# Patient Record
Sex: Male | Born: 1995 | Hispanic: Yes | Marital: Single | Smoking: Never smoker
Health system: Southern US, Community
[De-identification: ages and names within clinical notes are randomized; demographics above are authoritative.]

---
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
## 2016-01-26 IMAGING — CR LEFT RIBS AND CHEST - 3+ VIEW
5 series · 5 of 5 positions shown · non-contrast
Comparison: None.

CLINICAL DATA: LEFT-sided pain.  Fall 6 months ago.  Chest pain

EXAM:
LEFT RIBS AND CHEST - 3+ VIEW

[chest pa]
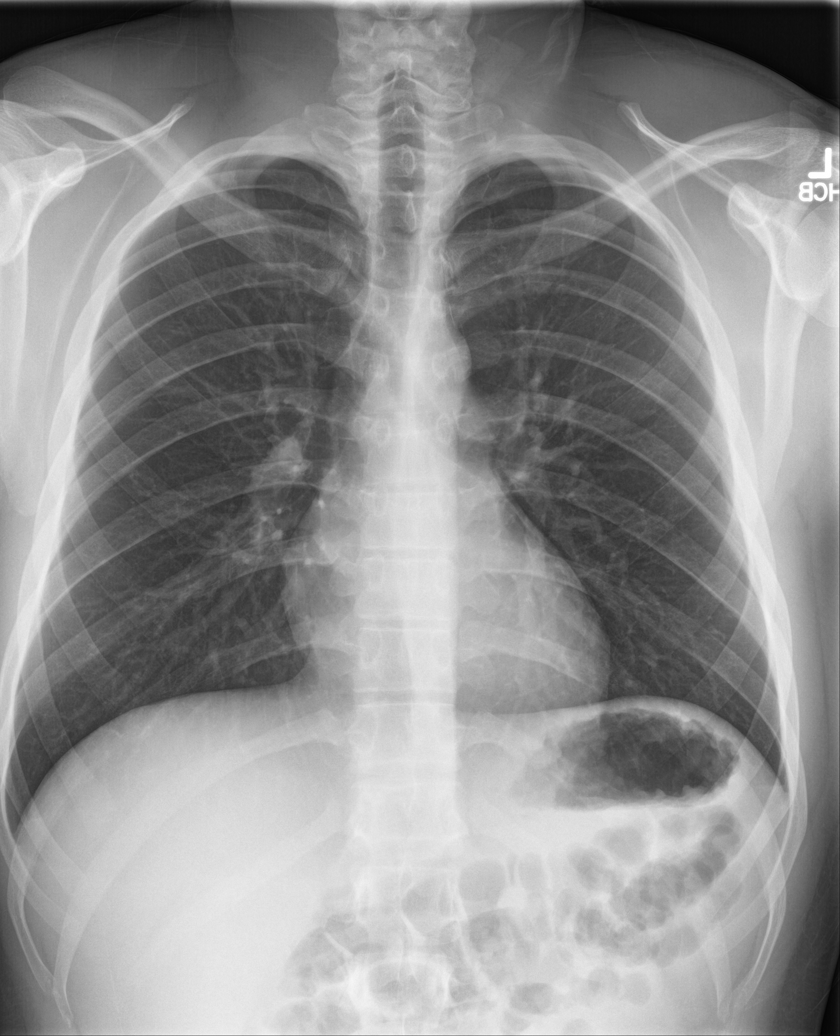

[rib pa (1 of 2)]
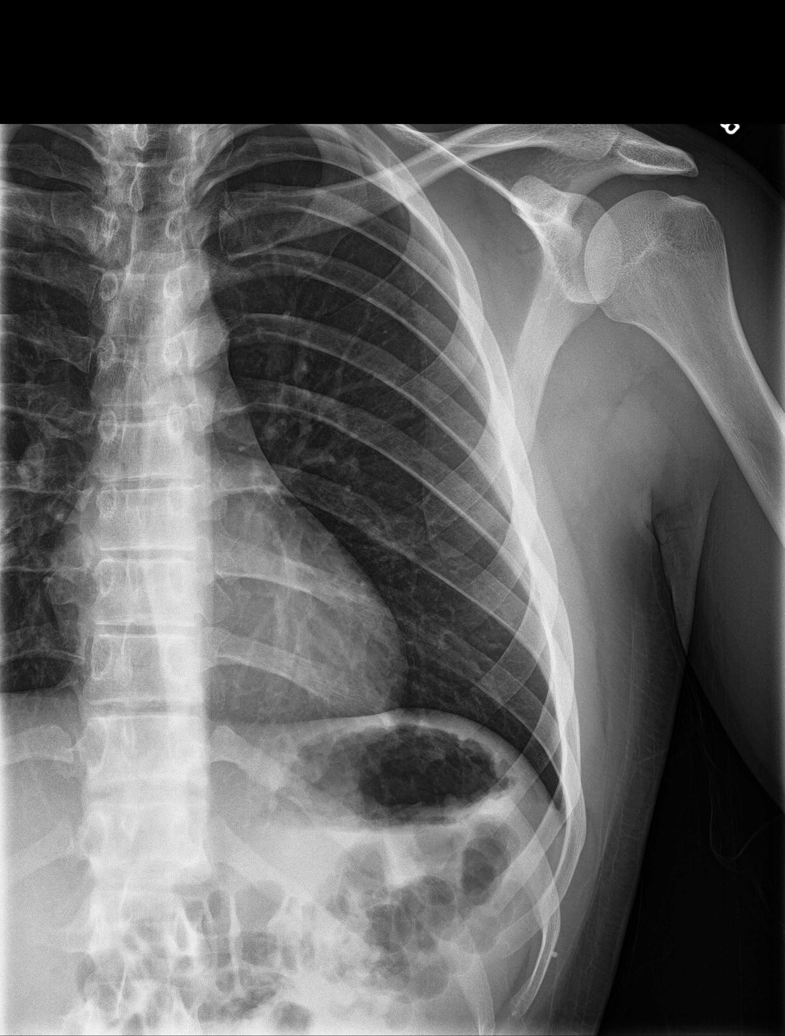

[rib obl (1 of 2)]
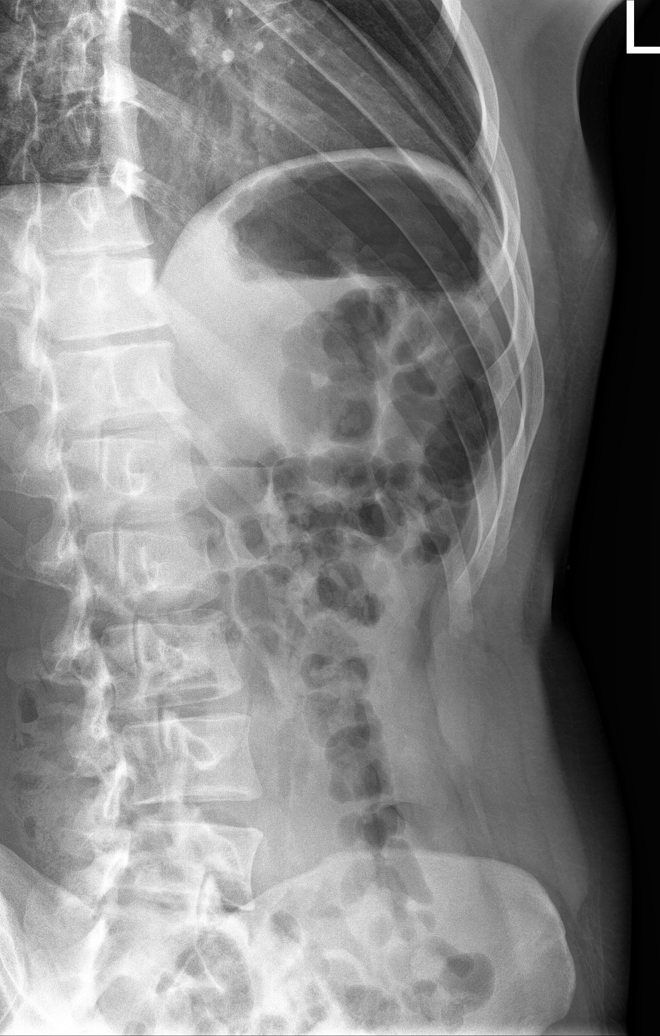

[rib obl (2 of 2)]
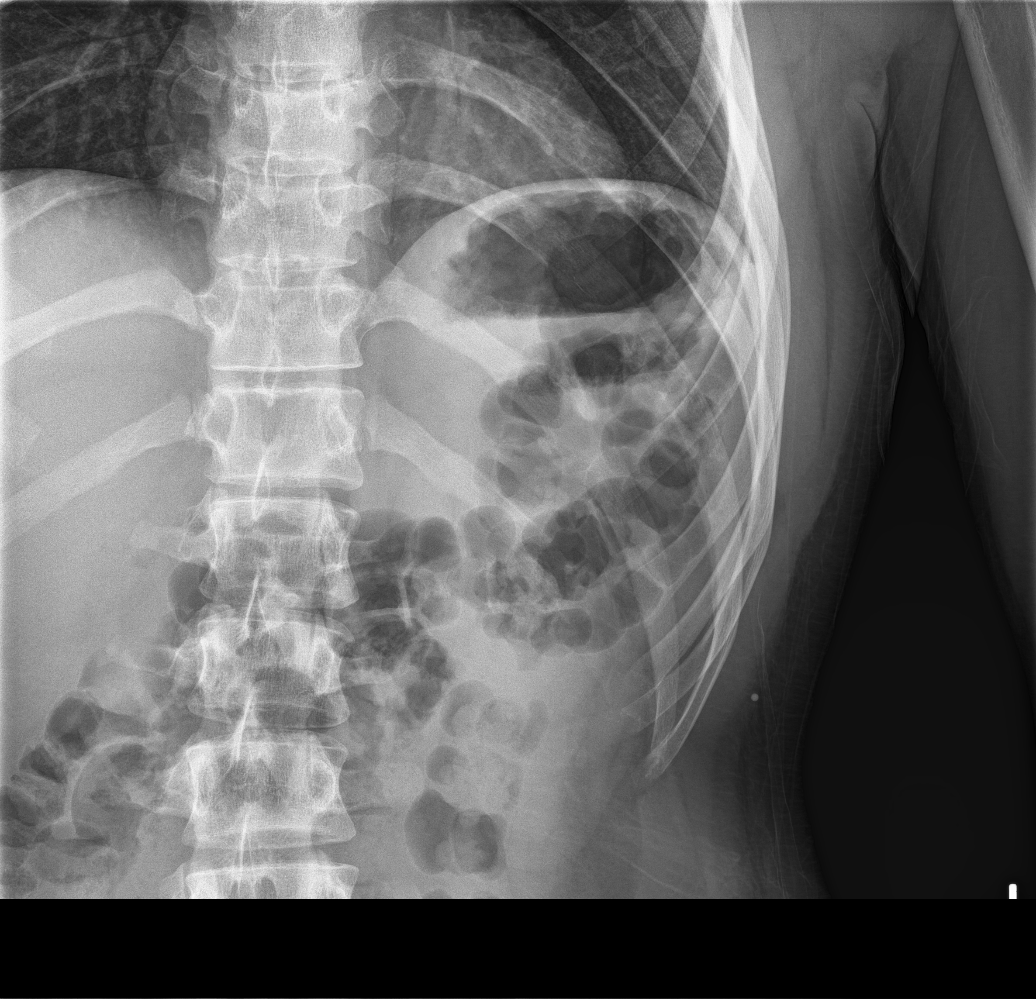

[rib pa (2 of 2)]
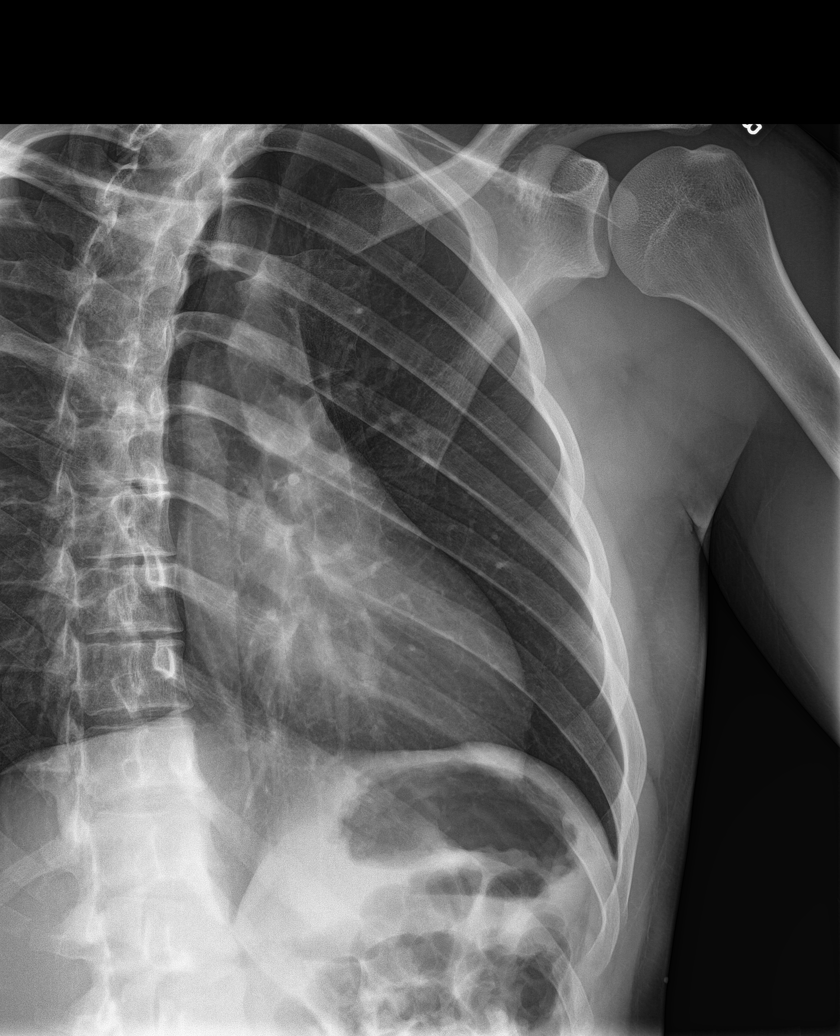

[5 of 5 positions shown; findings below may reference images not displayed]

FINDINGS: No fracture or other bone lesions are seen involving the ribs. There
is no evidence of pneumothorax or pleural effusion. Both lungs are
clear. Heart size and mediastinal contours are within normal limits.
IMPRESSION: Negative.

## 2024-07-09 DIAGNOSIS — Y92410 Unspecified street and highway as the place of occurrence of the external cause: Secondary | ICD-10-CM | POA: Insufficient documentation

## 2024-07-09 DIAGNOSIS — S39012A Strain of muscle, fascia and tendon of lower back, initial encounter: Secondary | ICD-10-CM | POA: Insufficient documentation

## 2024-07-09 DIAGNOSIS — S161XXA Strain of muscle, fascia and tendon at neck level, initial encounter: Secondary | ICD-10-CM | POA: Insufficient documentation

## 2024-07-10 ENCOUNTER — Emergency Department
Admission: EM | Admit: 2024-07-10 | Discharge: 2024-07-10 | Disposition: A | Discharge status: Home / Self Care | Payer: Self-pay | Attending: Emergency Medicine | Admitting: Emergency Medicine

## 2024-07-10 ENCOUNTER — Other Ambulatory Visit: Payer: Self-pay

## 2024-07-10 ENCOUNTER — Emergency Department: Payer: Self-pay

## 2024-07-10 DIAGNOSIS — S161XXA Strain of muscle, fascia and tendon at neck level, initial encounter: Secondary | ICD-10-CM

## 2024-07-10 DIAGNOSIS — S39012A Strain of muscle, fascia and tendon of lower back, initial encounter: Secondary | ICD-10-CM

## 2024-07-10 NOTE — Discharge Instructions (Signed)
 Take acetaminophen 650 mg and ibuprofen 400 mg every 6 hours for pain.  Take with food.  Thank you for choosing [place] for your health care today!  Please see your primary doctor this week for a follow up appointment.   If you have any new, worsening, or unexpected symptoms call your doctor right away or come back to the emergency department for reevaluation.  It was my pleasure to care for you today.   Daneil Dan Modesto Charon, MD
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
